# Patient Record
Sex: Female | Born: 1985 | Race: White | Hispanic: No | Marital: Single | Smoking: Current every day smoker
Health system: Southern US, Community
[De-identification: ages and names within clinical notes are randomized; demographics above are authoritative.]

## PROBLEM LIST (undated history)

## (undated) DIAGNOSIS — J45909 Unspecified asthma, uncomplicated: Secondary | ICD-10-CM

---

## 2012-08-24 ENCOUNTER — Emergency Department (HOSPITAL_COMMUNITY)
Admission: EM | Admit: 2012-08-24 | Discharge: 2012-08-24 | Disposition: A | Discharge status: Home / Self Care | Payer: Medicaid Other | Attending: Emergency Medicine | Admitting: Emergency Medicine

## 2012-08-24 ENCOUNTER — Encounter (HOSPITAL_COMMUNITY): Payer: Self-pay

## 2012-08-24 DIAGNOSIS — J111 Influenza due to unidentified influenza virus with other respiratory manifestations: Secondary | ICD-10-CM | POA: Insufficient documentation

## 2012-08-24 DIAGNOSIS — R000 Tachycardia, unspecified: Secondary | ICD-10-CM | POA: Insufficient documentation

## 2012-08-24 DIAGNOSIS — F17200 Nicotine dependence, unspecified, uncomplicated: Secondary | ICD-10-CM | POA: Insufficient documentation

## 2012-08-24 MED ORDER — PROMETHAZINE-CODEINE 6.25-10 MG/5ML PO SYRP: 5.0000 mL | ORAL_SOLUTION | ORAL | Status: DC | PRN

## 2012-08-24 MED ORDER — PSEUDOEPHEDRINE HCL 60 MG PO TABS: ORAL_TABLET | ORAL | Status: DC

## 2012-08-24 NOTE — ED Provider Notes (Signed)
History     CSN: [id number]  Arrival date & time 08/24/12  4098   First MD Initiated Contact with Patient 08/24/12 [phone]      Chief Complaint  Patient presents with  . URI    (Consider location/radiation/quality/duration/timing/severity/associated sxs/prior treatment) Patient is a 27 y.o. female presenting with URI. The history is provided by the patient.  URI The primary symptoms include fever, fatigue, headaches, cough, wheezing and myalgias. Primary symptoms do not include abdominal pain, vomiting, arthralgias or rash. The current episode started 3 to 5 days ago. This is a new problem. The problem has been gradually worsening.  The headache is not associated with photophobia.  The onset of the illness is associated with exposure to sick contacts. Symptoms associated with the illness include chills, sinus pressure and congestion.    History reviewed. No pertinent past medical history.  History reviewed. No pertinent past surgical history.  No family history on file.  History  Substance Use Topics  . Smoking status: Current Every Day Smoker  . Smokeless tobacco: Not on file  . Alcohol Use: No    OB History    Grav Para Term Preterm Abortions TAB SAB Ect Mult Living                  Review of Systems  Constitutional: Positive for fever, chills and fatigue. Negative for activity change.       All ROS Neg except as noted in HPI  HENT: Positive for congestion and sinus pressure. Negative for nosebleeds and neck pain.   Eyes: Negative for photophobia and discharge.  Respiratory: Positive for cough and wheezing. Negative for shortness of breath.   Cardiovascular: Negative for chest pain and palpitations.  Gastrointestinal: Negative for vomiting, abdominal pain and blood in stool.  Genitourinary: Negative for dysuria, frequency and hematuria.  Musculoskeletal: Positive for myalgias. Negative for back pain and arthralgias.  Skin: Negative.  Negative for rash.  Neurological:  Positive for headaches. Negative for dizziness, seizures and speech difficulty.  Psychiatric/Behavioral: Negative for hallucinations and confusion.    Allergies  Review of patient's allergies indicates no known allergies.  Home Medications   Current Outpatient Rx  Name  Route  Sig  Dispense  Refill  . PHENYLEPHRINE-DM-GG-APAP 5-10-200-325 MG PO TABS   Oral   Take 2 tablets by mouth every 4 (four) hours as needed. Cold and or flu like symptoms           BP 135/84  Pulse 103  Temp 98.4 F (36.9 C) (Oral)  Resp 18  Ht 5\' 7"  (1.702 m)  Wt 180 lb (81.647 kg)  BMI 28.19 kg/m2  SpO2 97%  Physical Exam  Nursing note and vitals reviewed. Constitutional: She is oriented to person, place, and time. She appears well-developed and well-nourished.  Non-toxic appearance.  HENT:  Head: Normocephalic.  Right Ear: Tympanic membrane and external ear normal.  Left Ear: Tympanic membrane and external ear normal.       Nasal congestion  Eyes: EOM and lids are normal. Pupils are equal, round, and reactive to light.  Neck: Normal range of motion. Neck supple. Carotid bruit is not present.  Cardiovascular: Regular rhythm, normal heart sounds, intact distal pulses and normal pulses.  Tachycardia present.   Pulmonary/Chest: Breath sounds normal. No respiratory distress.  Abdominal: Soft. Bowel sounds are normal. There is no tenderness. There is no guarding.  Musculoskeletal: Normal range of motion.  Lymphadenopathy:       Head (right side): No submandibular  adenopathy present.       Head (left side): No submandibular adenopathy present.    She has no cervical adenopathy.  Neurological: She is alert and oriented to person, place, and time. She has normal strength. No cranial nerve deficit or sensory deficit.  Skin: Skin is warm and dry.  Psychiatric: She has a normal mood and affect. Her speech is normal.    ED Course  Procedures (including critical care time)  Labs Reviewed - No data to  display No results found.   No diagnosis found.    MDM  I have reviewed nursing notes, vital signs, and all appropriate lab and imaging results for this patient. Patient presents to the emergency department with 3 days of cough, fever, chills, body aches, and at times some chest tightness. The examination was consistent with upper respiratory infection /flulike illness.  Patient advised to increase fluids. Use Tylenol and Motrin for fever and body aches. Prescription for promethazine cough medication 5 mL every 4 hours, and Sudafed 60 mg 3 times daily for congestion given to the patient. Patient is to see her primary physician or return to the emergency department if any changes, problems, or concerns.       Kathie Dike, [place] 08/24/12 1800

## 2012-08-24 NOTE — ED Notes (Signed)
Pt c/o chest tightness, nonproductive cough, fever, chills, and bodyaches since Friday.

## 2012-08-27 NOTE — ED Provider Notes (Signed)
Medical screening examination/treatment/procedure(s) were performed by non-physician practitioner and as supervising physician I was immediately available for consultation/collaboration.   Joya Gaskins, MD 08/27/12 [phone]

## 2012-09-19 ENCOUNTER — Emergency Department (HOSPITAL_COMMUNITY): Payer: Medicaid Other

## 2012-09-19 ENCOUNTER — Emergency Department (HOSPITAL_COMMUNITY)
Admission: EM | Admit: 2012-09-19 | Discharge: 2012-09-19 | Disposition: A | Discharge status: Home / Self Care | Payer: Medicaid Other | Attending: Emergency Medicine | Admitting: Emergency Medicine

## 2012-09-19 ENCOUNTER — Encounter (HOSPITAL_COMMUNITY): Payer: Self-pay | Admitting: *Deleted

## 2012-09-19 DIAGNOSIS — J159 Unspecified bacterial pneumonia: Secondary | ICD-10-CM | POA: Insufficient documentation

## 2012-09-19 DIAGNOSIS — M549 Dorsalgia, unspecified: Secondary | ICD-10-CM | POA: Insufficient documentation

## 2012-09-19 DIAGNOSIS — R51 Headache: Secondary | ICD-10-CM | POA: Insufficient documentation

## 2012-09-19 DIAGNOSIS — R6883 Chills (without fever): Secondary | ICD-10-CM | POA: Insufficient documentation

## 2012-09-19 DIAGNOSIS — F17200 Nicotine dependence, unspecified, uncomplicated: Secondary | ICD-10-CM | POA: Insufficient documentation

## 2012-09-19 DIAGNOSIS — J189 Pneumonia, unspecified organism: Secondary | ICD-10-CM

## 2012-09-19 DIAGNOSIS — R071 Chest pain on breathing: Secondary | ICD-10-CM | POA: Insufficient documentation

## 2012-09-19 DIAGNOSIS — R42 Dizziness and giddiness: Secondary | ICD-10-CM | POA: Insufficient documentation

## 2012-09-19 LAB — BASIC METABOLIC PANEL
CO2: 21 mEq/L (ref 19–32)
Chloride: 95 mEq/L — ABNORMAL LOW (ref 96–112)
Glucose, Bld: 103 mg/dL — ABNORMAL HIGH (ref 70–99)
Potassium: 3.5 mEq/L (ref 3.5–5.1)
Sodium: 129 mEq/L — ABNORMAL LOW (ref 135–145)

## 2012-09-19 LAB — CBC WITH DIFFERENTIAL/PLATELET
Basophils Relative: 0 % (ref 0–1)
Eosinophils Relative: 0 % (ref 0–5)
Hemoglobin: 14.6 g/dL (ref 12.0–15.0)
Lymphocytes Relative: 5 % — ABNORMAL LOW (ref 12–46)
MCH: 33.1 pg (ref 26.0–34.0)
Monocytes Absolute: 1.3 10*3/uL — ABNORMAL HIGH (ref 0.1–1.0)
Monocytes Relative: 5 % (ref 3–12)
Neutrophils Relative %: 90 % — ABNORMAL HIGH (ref 43–77)
Platelets: 261 10*3/uL (ref 150–400)
RBC: 4.41 MIL/uL (ref 3.87–5.11)
WBC: 25 10*3/uL — ABNORMAL HIGH (ref 4.0–10.5)

## 2012-09-19 LAB — D-DIMER, QUANTITATIVE: D-Dimer, Quant: 0.53 ug/mL-FEU — ABNORMAL HIGH (ref 0.00–0.48)

## 2012-09-19 MED ORDER — SODIUM CHLORIDE 0.9 % IV BOLUS (SEPSIS)
1000.0000 mL | Freq: Once | INTRAVENOUS | Status: AC
  Administered 2012-09-19: 1000 mL via INTRAVENOUS

## 2012-09-19 MED ORDER — LEVOFLOXACIN 500 MG PO TABS: 500.0000 mg | ORAL_TABLET | Freq: Every day | ORAL | Status: DC

## 2012-09-19 MED ORDER — ACETAMINOPHEN 325 MG PO TABS
650.0000 mg | ORAL_TABLET | Freq: Once | ORAL | Status: AC
  Administered 2012-09-19: 650 mg via ORAL
  Filled 2012-09-19: qty 2

## 2012-09-19 MED ORDER — IOHEXOL 350 MG/ML SOLN
100.0000 mL | Freq: Once | INTRAVENOUS | Status: AC | PRN
  Administered 2012-09-19: 100 mL via INTRAVENOUS

## 2012-09-19 MED ORDER — LEVOFLOXACIN IN D5W 500 MG/100ML IV SOLN
500.0000 mg | Freq: Once | INTRAVENOUS | Status: AC
  Administered 2012-09-19: 500 mg via INTRAVENOUS
  Filled 2012-09-19: qty 100

## 2012-09-19 MED ORDER — IBUPROFEN 800 MG PO TABS
800.0000 mg | ORAL_TABLET | Freq: Once | ORAL | Status: AC
  Administered 2012-09-19: 800 mg via ORAL
  Filled 2012-09-19: qty 1

## 2012-09-19 NOTE — ED Notes (Signed)
Cough x 3 -4 days, productive and yellow in color. Pain to left mid back. Light headed and chills.

## 2012-09-19 NOTE — ED Provider Notes (Signed)
History   This chart was scribed for Gerhard Munch, MD by Toya Smothers, ED Scribe. The patient was seen in room APA03/APA03. Patient's care was started at 1406.  CSN: [id number]  Arrival date & time 09/19/12  1406   First MD Initiated Contact with Patient 09/19/12 1423      Chief Complaint  Patient presents with  . Cough  . Chills    HPI  Wanda Mcdonald is a 27 y.o. female who presents to the Emergency Department complaining of 1 day new, sudden onset, progressive, moderate back pain inferior to left lateral rib cage. Pain is sharp, worse with cough and breathing, and not associated with exertion. Today Pt is light headed and experiencing chills. No fever, congestion, rhinorrhea, chest pain, SOB, or n/v/d. Pt is a current everyday smoker, denying alcohol and illicit drug use. Pt is currently using intra-uteral contraceptive.   History reviewed. No pertinent past medical history.  History reviewed. No pertinent past surgical history.  No family history on file.  History  Substance Use Topics  . Smoking status: Current Every Day Smoker  . Smokeless tobacco: Not on file  . Alcohol Use: No     Review of Systems  Constitutional: Positive for chills.       Per HPI, otherwise negative  HENT:       Per HPI, otherwise negative  Eyes: Negative.   Respiratory: Positive for cough.        Per HPI, otherwise negative  Cardiovascular:       Per HPI, otherwise negative  Gastrointestinal: Negative for vomiting.  Genitourinary: Negative.   Musculoskeletal: Positive for back pain.       Per HPI, otherwise negative  Skin: Negative.   Neurological: Positive for light-headedness and headaches. Negative for syncope.    Allergies  Penicillins  Home Medications   Current Outpatient Rx  Name  Route  Sig  Dispense  Refill  . LEVONORGESTREL 20 MCG/24HR IU IUD   Intrauterine   1 each by Intrauterine route once.         Marland Kitchen PSEUDOEPHEDRINE HCL 60 MG PO TABS   Oral   Take 60 mg by  mouth every 4 (four) hours as needed. For congestion         . PROMETHAZINE-CODEINE 6.25-10 MG/5ML PO SYRP   Oral   Take 5 mLs by mouth every 4 (four) hours as needed for cough.   150 mL   0     BP 136/87  Pulse 138  Temp 100.6 F (38.1 C) (Oral)  Resp 20  Ht 5\' 7"  (1.702 m)  Wt 185 lb (83.915 kg)  BMI 28.97 kg/m2  SpO2 98%  Physical Exam  Nursing note and vitals reviewed. Constitutional: She is oriented to person, place, and time. She appears well-developed and well-nourished. No distress.  HENT:  Head: Normocephalic and atraumatic.  Eyes: Conjunctivae normal and EOM are normal.  Cardiovascular: Normal rate and regular rhythm.   Pulmonary/Chest: Effort normal and breath sounds normal. No stridor. No respiratory distress.  Abdominal: She exhibits no distension.  Musculoskeletal: She exhibits no edema.       Tenderness to palpation inferior to lateral left rib cage.  Neurological: She is alert and oriented to person, place, and time. No cranial nerve deficit.  Skin: Skin is warm and dry.  Psychiatric: She has a normal mood and affect.    ED Course  Procedures DIAGNOSTIC STUDIES: Oxygen Saturation is 98% on room air, normal by my interpretation.  COORDINATION OF CARE: 14:21- Ordered DG Chest 2 View 1 time imaging. 14:40- Evaluated Pt. Pt is awake, alert, and without distress. 14:45- Ordered Basic metabolic panel, CBC with Differential, and Urinalysis, Routine w reflex microscopic. 14:47- Patient understand and agree with initial ED impression and plan with expectations set for ED visit.     Labs Reviewed - No data to display No results found.   No diagnosis found.  Update: Patient's d-dimer is elevated.  Given her tachycardia, use of cigarettes, there suspicion for PE.  CT ordered.   5:18 PM Patient sitting upright, in no distress.  I reviewed the CT results, x-ray results with her. MDM   I personally performed the services described in this  documentation, which was scribed in my presence. The recorded information has been reviewed and is accurate.    This young female, who smokes, numbers and with tachycardia, dyspnea, pleuritic left-sided pain.  The patient's fever suggests infectious etiology, d-dimer performed to rule out PE.  Subsequent CT scan did not demonstrate PE, but characterize the left lower lobe pneumonia.  The patient was in no distress, vital signs improved, and she was appropriate for discharge with primary care followup.  Gerhard Munch, MD 09/19/12 [phone]

## 2013-04-20 ENCOUNTER — Emergency Department (HOSPITAL_COMMUNITY)
Admission: EM | Admit: 2013-04-20 | Discharge: 2013-04-20 | Disposition: A | Discharge status: Home / Self Care | Payer: Medicaid Other | Attending: Emergency Medicine | Admitting: Emergency Medicine

## 2013-04-20 ENCOUNTER — Emergency Department (HOSPITAL_COMMUNITY): Payer: Medicaid Other

## 2013-04-20 ENCOUNTER — Encounter (HOSPITAL_COMMUNITY): Payer: Self-pay | Admitting: *Deleted

## 2013-04-20 DIAGNOSIS — J209 Acute bronchitis, unspecified: Secondary | ICD-10-CM

## 2013-04-20 DIAGNOSIS — F17200 Nicotine dependence, unspecified, uncomplicated: Secondary | ICD-10-CM | POA: Insufficient documentation

## 2013-04-20 DIAGNOSIS — Z880 Allergy status to penicillin: Secondary | ICD-10-CM | POA: Insufficient documentation

## 2013-04-20 DIAGNOSIS — J3489 Other specified disorders of nose and nasal sinuses: Secondary | ICD-10-CM | POA: Insufficient documentation

## 2013-04-20 DIAGNOSIS — Z8701 Personal history of pneumonia (recurrent): Secondary | ICD-10-CM | POA: Insufficient documentation

## 2013-04-20 DIAGNOSIS — J45901 Unspecified asthma with (acute) exacerbation: Secondary | ICD-10-CM | POA: Insufficient documentation

## 2013-04-20 DIAGNOSIS — M549 Dorsalgia, unspecified: Secondary | ICD-10-CM | POA: Insufficient documentation

## 2013-04-20 HISTORY — DX: Unspecified asthma, uncomplicated: J45.909

## 2013-04-20 MED ORDER — PREDNISONE 50 MG PO TABS: 50.0000 mg | ORAL_TABLET | Freq: Every day | ORAL | Status: DC

## 2013-04-20 MED ORDER — ALBUTEROL SULFATE (5 MG/ML) 0.5% IN NEBU
5.0000 mg | INHALATION_SOLUTION | Freq: Once | RESPIRATORY_TRACT | Status: AC
  Administered 2013-04-20: 5 mg via RESPIRATORY_TRACT
  Filled 2013-04-20: qty 1

## 2013-04-20 MED ORDER — ALBUTEROL SULFATE HFA 108 (90 BASE) MCG/ACT IN AERS
1.0000 | INHALATION_SPRAY | RESPIRATORY_TRACT | Status: DC | PRN
  Administered 2013-04-20: 2 via RESPIRATORY_TRACT
  Filled 2013-04-20: qty 6.7

## 2013-04-20 NOTE — ED Provider Notes (Signed)
CSN: [id number]     Arrival date & time 04/20/13  7829 History   This chart was scribed for Celene Kras, MD, by Yevette Edwards, ED Scribe. This patient was seen in room APA02/APA02 and the patient's care was started at 9:39 AM. First MD Initiated Contact with Patient 04/20/13 [phone]     Chief Complaint  Patient presents with  . Cough    The history is provided by the patient. No language interpreter was used.   HPI Comments: Wanda Mcdonald is a 27 y.o. female, with a h/o asthma and who is a current smoker, who presents to the Emergency Department complaining of a gradually increasing, intermittent cough which has been occurring for five days. She has also experienced upper left-sided back pain, which she rates as 8/10, and congestion as associated symptoms. She states that her current symptoms are similar to a prior episode of pneumonia she experienced. The pt denies a fever, emesis, diarrhea, dysuria, or swelling to her lower extremities. She also denies any h/o blood clots and any recent long travels. The pt does not use an inhaler for asthma at home.    Past Medical History  Diagnosis Date  . Asthma     childhood   History reviewed. No pertinent past surgical history. No family history on file. History  Substance Use Topics  . Smoking status: Current Every Day Smoker    Types: Cigarettes  . Smokeless tobacco: Not on file  . Alcohol Use: No   No OB history provided.  Review of Systems A complete 10 system review of systems was obtained, and all systems were negative except where indicated in the HPI and PE.   Allergies  Penicillins  Home Medications   Current Outpatient Rx  Name  Route  Sig  Dispense  Refill  . levonorgestrel (MIRENA) 20 MCG/24HR IUD   Intrauterine   1 each by Intrauterine route once.         . predniSONE (DELTASONE) 50 MG tablet   Oral   Take 1 tablet (50 mg total) by mouth daily.   5 tablet   0     Dispense as written.    Triage Vitals: BP 131/69   Pulse 102  Temp(Src) 98 F (36.7 C) (Oral)  Resp 16  SpO2 94% Physical Exam  Nursing note and vitals reviewed. Constitutional: She appears well-developed and well-nourished. No distress.  HENT:  Head: Normocephalic and atraumatic.  Right Ear: External ear normal.  Left Ear: External ear normal.  Eyes: Conjunctivae are normal. Right eye exhibits no discharge. Left eye exhibits no discharge. No scleral icterus.  Neck: Neck supple. No tracheal deviation present.  Cardiovascular: Normal rate, regular rhythm and intact distal pulses.   Pulmonary/Chest: Effort normal. No stridor. No respiratory distress. She has wheezes. She has no rales.  Slight wheeze to right side.   Abdominal: Soft. Bowel sounds are normal. She exhibits no distension. There is no tenderness. There is no rebound and no guarding.  Musculoskeletal: She exhibits no edema and no tenderness.  Neurological: She is alert. She has normal strength. No sensory deficit. Cranial nerve deficit:  no gross defecits noted. She exhibits normal muscle tone. She displays no seizure activity. Coordination normal.  Skin: Skin is warm and dry. No rash noted.  Psychiatric: She has a normal mood and affect.    ED Course  Procedures (including critical care time)  DIAGNOSTIC STUDIES: Oxygen Saturation is 94% on room air, adequate by my interpretation.  COORDINATION OF CARE:  9:41 AM- Discussed treatment plan with patient with imaging, and the patient agreed to the plan. I counseled the patient on smoking cessation  Labs Review Labs Reviewed - No data to display Imaging Review Dg Chest 2 View  04/20/2013   *RADIOLOGY REPORT*  Clinical Data: Cough  CHEST - 2 VIEW  Comparison: Chest radiograph September 19, 2012 and chest CT September 19, 2012  Findings: Lungs clear.  Heart size and pulmonary vascularity are normal.  No adenopathy.  No bone lesions.  IMPRESSION: No abnormality noted.   Original Report Authenticated By: Bretta Bang, M.D.     MDM   1. Bronchitis, acute, with bronchospasm    No evidence of pneumonia. Low risk for PE. I counseled the patient on smoking cessation. I will discharge her home in an albuterol inhaler and a short course of steroids  I personally performed the services described in this documentation, which was scribed in my presence.  The recorded information has been reviewed and is accurate.     Celene Kras, MD 04/20/13 [phone]

## 2013-04-20 NOTE — ED Notes (Signed)
Increased cough and upper left sided back pain x 5 days.  Denies fever.

## 2013-09-09 ENCOUNTER — Encounter (HOSPITAL_COMMUNITY): Payer: Self-pay | Admitting: Emergency Medicine

## 2013-09-09 ENCOUNTER — Emergency Department (HOSPITAL_COMMUNITY): Payer: Medicaid Other

## 2013-09-09 ENCOUNTER — Emergency Department (HOSPITAL_COMMUNITY)
Admission: EM | Admit: 2013-09-09 | Discharge: 2013-09-09 | Disposition: A | Discharge status: Home / Self Care | Payer: Medicaid Other | Attending: Emergency Medicine | Admitting: Emergency Medicine

## 2013-09-09 DIAGNOSIS — J029 Acute pharyngitis, unspecified: Secondary | ICD-10-CM | POA: Insufficient documentation

## 2013-09-09 DIAGNOSIS — Z8701 Personal history of pneumonia (recurrent): Secondary | ICD-10-CM | POA: Insufficient documentation

## 2013-09-09 DIAGNOSIS — J45901 Unspecified asthma with (acute) exacerbation: Secondary | ICD-10-CM | POA: Insufficient documentation

## 2013-09-09 DIAGNOSIS — J209 Acute bronchitis, unspecified: Secondary | ICD-10-CM | POA: Insufficient documentation

## 2013-09-09 DIAGNOSIS — R42 Dizziness and giddiness: Secondary | ICD-10-CM | POA: Insufficient documentation

## 2013-09-09 DIAGNOSIS — R5381 Other malaise: Secondary | ICD-10-CM | POA: Insufficient documentation

## 2013-09-09 DIAGNOSIS — IMO0002 Reserved for concepts with insufficient information to code with codable children: Secondary | ICD-10-CM | POA: Insufficient documentation

## 2013-09-09 DIAGNOSIS — R000 Tachycardia, unspecified: Secondary | ICD-10-CM | POA: Insufficient documentation

## 2013-09-09 DIAGNOSIS — R5383 Other fatigue: Secondary | ICD-10-CM

## 2013-09-09 DIAGNOSIS — F17200 Nicotine dependence, unspecified, uncomplicated: Secondary | ICD-10-CM | POA: Insufficient documentation

## 2013-09-09 DIAGNOSIS — Z880 Allergy status to penicillin: Secondary | ICD-10-CM | POA: Insufficient documentation

## 2013-09-09 MED ORDER — ALBUTEROL SULFATE (2.5 MG/3ML) 0.083% IN NEBU
2.5000 mg | INHALATION_SOLUTION | Freq: Once | RESPIRATORY_TRACT | Status: AC
  Administered 2013-09-09: 2.5 mg via RESPIRATORY_TRACT
  Filled 2013-09-09: qty 3

## 2013-09-09 MED ORDER — PREDNISONE 10 MG PO TABS: 20.0000 mg | ORAL_TABLET | Freq: Two times a day (BID) | ORAL | Status: AC

## 2013-09-09 MED ORDER — AZITHROMYCIN 250 MG PO TABS
500.0000 mg | ORAL_TABLET | Freq: Once | ORAL | Status: AC
Start: 2013-09-09 — End: 2013-09-09
  Administered 2013-09-09: 500 mg via ORAL
  Filled 2013-09-09: qty 2

## 2013-09-09 MED ORDER — ALBUTEROL SULFATE HFA 108 (90 BASE) MCG/ACT IN AERS
2.0000 | INHALATION_SPRAY | RESPIRATORY_TRACT | Status: DC | PRN
  Administered 2013-09-09: 2 via RESPIRATORY_TRACT
  Filled 2013-09-09: qty 6.7

## 2013-09-09 MED ORDER — PREDNISONE 50 MG PO TABS
60.0000 mg | ORAL_TABLET | Freq: Once | ORAL | Status: AC
  Administered 2013-09-09: 60 mg via ORAL
  Filled 2013-09-09 (×2): qty 1

## 2013-09-09 MED ORDER — AZITHROMYCIN 250 MG PO TABS: ORAL_TABLET | ORAL | Status: AC

## 2013-09-09 MED ORDER — IPRATROPIUM-ALBUTEROL 0.5-2.5 (3) MG/3ML IN SOLN
3.0000 mL | Freq: Once | RESPIRATORY_TRACT | Status: AC
  Administered 2013-09-09: 3 mL via RESPIRATORY_TRACT
  Filled 2013-09-09: qty 3

## 2013-09-09 NOTE — ED Provider Notes (Signed)
CSN: [id number]     Arrival date & time 09/09/13  1723 History   None    Chief Complaint  Patient presents with  . Cough   (Consider location/radiation/quality/duration/timing/severity/associated sxs/prior Treatment) Patient is a 28 y.o. female presenting with cough. The history is provided by the patient.  Cough Cough characteristics:  Hacking Severity:  Moderate Onset quality:  Gradual Duration:  3 days Progression:  Worsening Chronicity:  New Smoker: yes   Relieved by:  Nothing Worsened by:  Activity, smoking and lying down Associated symptoms: chills, fever, myalgias, sore throat and wheezing   Associated symptoms: no ear pain, no headaches, no rash and no sinus congestion    Wanda Mcdonald is a 28 y.o. female who presents to the ED with cough and congestion that started 3 days ago. She has been taking OTC medications without relief. She has had fever up to 101 and it goes down with tylenol and ibuprofen. She has had pneumonia in the past and bronchitis. She is an every day smoker. The symptoms get much worse when she tries to smoke.   Past Medical History  Diagnosis Date  . Asthma     childhood   History reviewed. No pertinent past surgical history. No family history on file. History  Substance Use Topics  . Smoking status: Current Every Day Smoker    Types: Cigarettes  . Smokeless tobacco: Not on file  . Alcohol Use: No   OB History   Grav Para Term Preterm Abortions TAB SAB Ect Mult Living                 Review of Systems  Constitutional: Positive for fever and chills.  HENT: Positive for congestion and sore throat. Negative for dental problem, ear pain, facial swelling and sinus pressure.   Eyes: Negative for pain and itching.  Respiratory: Positive for cough and wheezing.   Gastrointestinal: Negative for nausea, vomiting and abdominal pain.  Genitourinary: Negative for dysuria, urgency and frequency.  Musculoskeletal: Positive for myalgias.  Skin: Negative  for rash.  Neurological: Positive for light-headedness. Negative for dizziness, syncope and headaches.  Psychiatric/Behavioral: Negative for confusion. The patient is not nervous/anxious.     Allergies  Penicillins  Home Medications   Current Outpatient Rx  Name  Route  Sig  Dispense  Refill  . levonorgestrel (MIRENA) 20 MCG/24HR IUD   Intrauterine   1 each by Intrauterine route once.         . predniSONE (DELTASONE) 50 MG tablet   Oral   Take 1 tablet (50 mg total) by mouth daily.   5 tablet   0     Dispense as written.    BP 142/96  Pulse 106  Temp(Src) 98.3 F (36.8 C) (Oral)  Resp 18  Ht 5\' 6"  (1.676 m)  Wt 175 lb (79.379 kg)  BMI 28.26 kg/m2  SpO2 97% Physical Exam  Nursing note and vitals reviewed. Constitutional: She is oriented to person, place, and time. She appears well-developed and well-nourished. No distress.  HENT:  Head: Normocephalic and atraumatic.  Eyes: EOM are normal.  Neck: Neck supple.  Cardiovascular: Tachycardia present.   Pulmonary/Chest: Effort normal.  Abdominal: Soft. There is no tenderness.  Musculoskeletal: Normal range of motion.  Neurological: She is alert and oriented to person, place, and time. No cranial nerve deficit.  Skin: Skin is warm and dry.  Psychiatric: She has a normal mood and affect. Her behavior is normal.    ED Course  Procedures (  including critical care time) Labs Review 1945 After albuterol/atrovent neb more wheezing can be heard. Air movement has improved. Will repeat albuterol.  Re examined after second treatment and patient has improved. First does of prednisone and Zithromax given. She will continue her OTC cough and congestion medications.   Labs Reviewed - No data to display Imaging Review Dg Chest 2 View  09/09/2013   CLINICAL DATA:  Cough, congestion and fever.  Smoker.  EXAM: CHEST  2 VIEW  COMPARISON:  04/20/2013  FINDINGS: The heart size and mediastinal contours are stable. The lungs demonstrate  mild diffuse central airway thickening but no airspace disease or hyperinflation. There is no pleural effusion or pneumothorax.  IMPRESSION: Central airway thickening consistent with bronchitis. No evidence of pneumonia.   Electronically Signed   By: Roxy Horseman M.D.   On: 09/09/2013 18:10    MDM  28 y.o. female with cough cold, congestion and wheezing x 4 day. Will treat for bronchitis and bronchospasm. Encouraged patient to stop smoking. Albuterol given to the patient with instructions by respiratory on how to use. Patient stable for discharge without any immediate complications.  I have reviewed this patient's vital signs, nurses notes, appropriate labs and imaging.  I have discussed findings and plan of care with the patient and she voices understanding.  BP 134/74  Pulse 106  Temp(Src) 98.1 F (36.7 C) (Oral)  Resp 14  Ht 5\' 6"  (1.676 m)  Wt 175 lb (79.379 kg)  BMI 28.26 kg/m2  SpO2 95%      Medication List    TAKE these medications       azithromycin 250 MG tablet  Commonly known as:  ZITHROMAX  Take 1 tablet PO daily     predniSONE 10 MG tablet  Commonly known as:  DELTASONE  Take 2 tablets (20 mg total) by mouth 2 (two) times daily with a meal.      ASK your doctor about these medications       acetaminophen 500 MG tablet  Commonly known as:  TYLENOL  Take 1,000 mg by mouth every 6 (six) hours as needed for fever or headache.     guaiFENesin 600 MG 12 hr tablet  Commonly known as:  MUCINEX  Take 1,200 mg by mouth 2 (two) times daily as needed for cough or to loosen phlegm.     ibuprofen 200 MG tablet  Commonly known as:  ADVIL,MOTRIN  Take 800 mg by mouth every 6 (six) hours as needed for headache.     levonorgestrel 20 MCG/24HR IUD  Commonly known as:  MIRENA  1 each by Intrauterine route once.           [place], [place] 09/09/13 2048

## 2013-09-09 NOTE — ED Notes (Signed)
Pt c/o cough, congestion, fever, chills since Monday night.

## 2013-09-09 NOTE — ED Notes (Signed)
Pt has used inhalers before. Instruction given. Pt verbalized understanding.

## 2013-09-09 NOTE — Discharge Instructions (Signed)
Continue your over the counter cough and cold medication. Take the medications we give you as directed. You can use the inhaler every 4 hours as needed for wheezing. Return for worsening symptoms.

## 2013-09-10 NOTE — ED Provider Notes (Signed)
Medical screening examination/treatment/procedure(s) were performed by non-physician practitioner and as supervising physician I was immediately available for consultation/collaboration.  EKG Interpretation   None       Wandalene Abrams, MD, FACEP   Welda Azzarello L Oksana Deberry, MD 09/10/13 0113 

## 2015-12-14 IMAGING — CR DG CHEST 2V
2 series · 2 of 2 positions shown · non-contrast
Comparison: 04/20/2013

CLINICAL DATA: Cough, congestion and fever.  Smoker.

EXAM:
CHEST  2 VIEW

[view not recorded (1 of 2)]
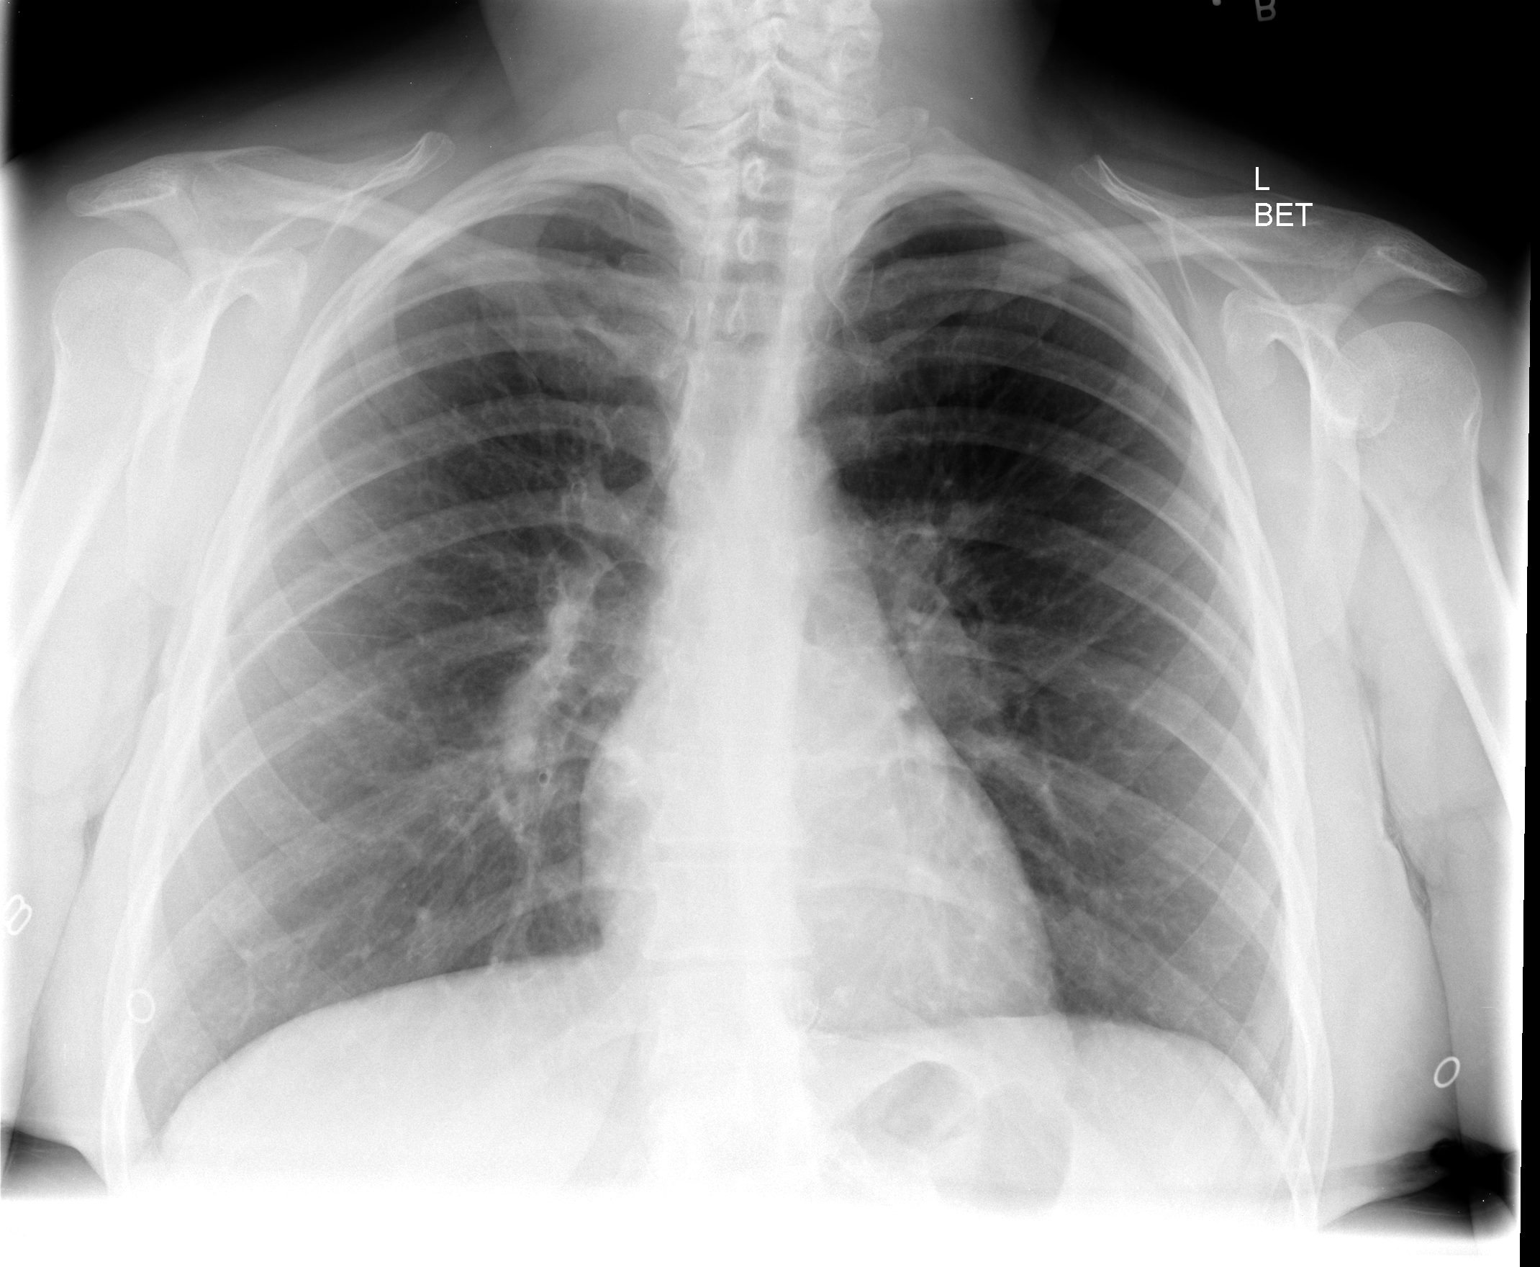

[view not recorded (2 of 2)]
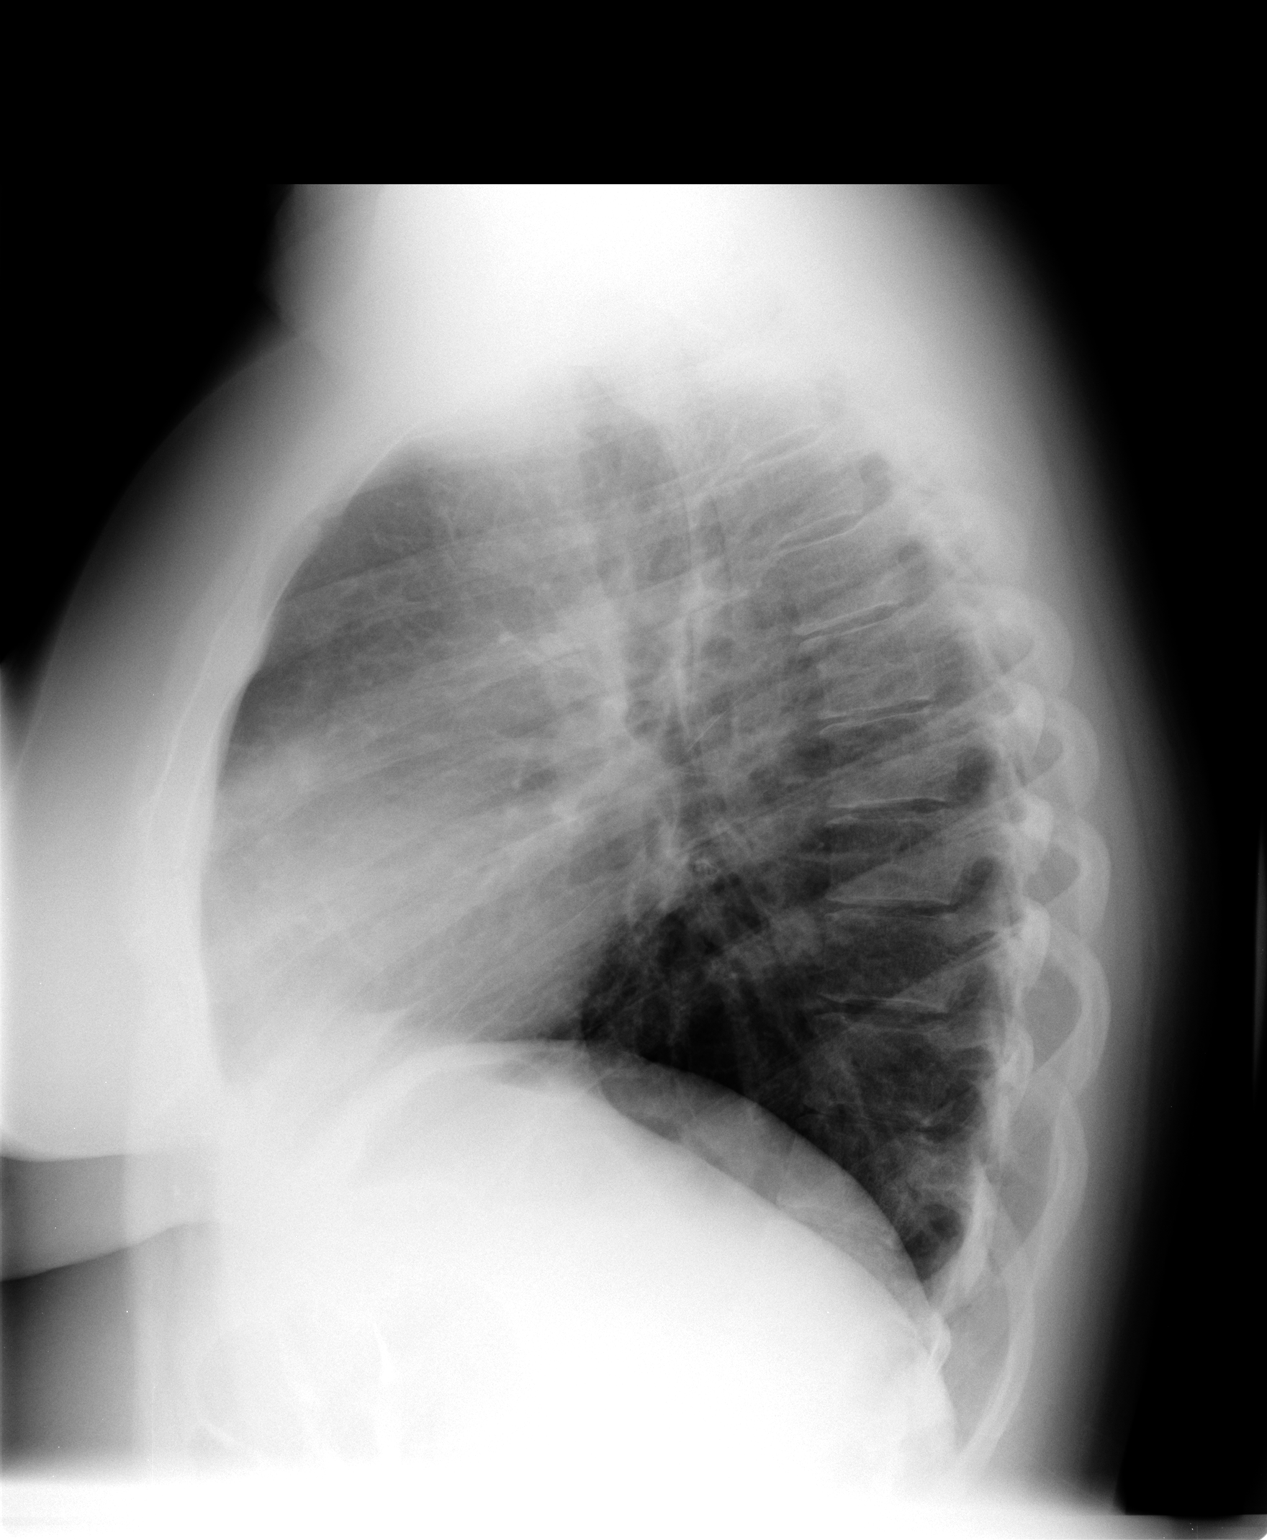

[2 of 2 positions shown; findings below may reference images not displayed]

FINDINGS: The heart size and mediastinal contours are stable. The lungs
demonstrate mild diffuse central airway thickening but no airspace
disease or hyperinflation. There is no pleural effusion or
pneumothorax.
IMPRESSION: Central airway thickening consistent with bronchitis. No evidence of
pneumonia.
